# Patient Record
Sex: Female | Born: 1970 | Race: White | Hispanic: No | Marital: Single | Smoking: Current every day smoker
Health system: Southern US, Community
[De-identification: ages and names within clinical notes are randomized; demographics above are authoritative.]

## PROBLEM LIST (undated history)

## (undated) HISTORY — PX: BREAST LUMPECTOMY: SHX2

## (undated) HISTORY — PX: SPINE SURGERY: SHX786

---

## 2009-05-05 ENCOUNTER — Ambulatory Visit: Payer: Self-pay | Admitting: Occupational Medicine

## 2009-05-05 DIAGNOSIS — J309 Allergic rhinitis, unspecified: Secondary | ICD-10-CM | POA: Insufficient documentation

## 2009-05-05 DIAGNOSIS — M654 Radial styloid tenosynovitis [de Quervain]: Secondary | ICD-10-CM

## 2009-06-02 ENCOUNTER — Ambulatory Visit: Payer: Self-pay | Admitting: Occupational Medicine

## 2009-06-02 LAB — CONVERTED CEMR LAB
Bilirubin Urine: NEGATIVE
Ketones, urine, test strip: NEGATIVE
Protein, U semiquant: 30
pH: 7

## 2009-11-24 ENCOUNTER — Ambulatory Visit: Payer: Self-pay | Admitting: Emergency Medicine

## 2009-11-24 LAB — CONVERTED CEMR LAB
Beta hcg, urine, semiquantitative: NEGATIVE
Bilirubin Urine: NEGATIVE
Glucose, Urine, Semiquant: NEGATIVE
Ketones, urine, test strip: NEGATIVE
Specific Gravity, Urine: >=1.03

## 2009-11-25 ENCOUNTER — Encounter: Payer: Self-pay | Admitting: Emergency Medicine

## 2010-01-30 ENCOUNTER — Ambulatory Visit
Admission: RE | Admit: 2010-01-30 | Discharge: 2010-01-30 | Payer: Self-pay | Source: Home / Self Care | Admitting: Family Medicine

## 2010-01-31 ENCOUNTER — Ambulatory Visit
Admission: RE | Admit: 2010-01-31 | Discharge: 2010-01-31 | Payer: Self-pay | Source: Home / Self Care | Admitting: Family Medicine

## 2010-01-31 ENCOUNTER — Telehealth (INDEPENDENT_AMBULATORY_CARE_PROVIDER_SITE_OTHER): Payer: Self-pay | Admitting: *Deleted

## 2010-01-31 ENCOUNTER — Encounter: Payer: Self-pay | Admitting: Family Medicine

## 2010-02-01 ENCOUNTER — Ambulatory Visit
Admission: RE | Admit: 2010-02-01 | Discharge: 2010-02-01 | Payer: Self-pay | Source: Home / Self Care | Admitting: Family Medicine

## 2010-02-01 ENCOUNTER — Encounter: Payer: Self-pay | Admitting: Family Medicine

## 2010-02-02 ENCOUNTER — Encounter: Payer: Self-pay | Admitting: Family Medicine

## 2010-02-04 ENCOUNTER — Ambulatory Visit
Admission: RE | Admit: 2010-02-04 | Discharge: 2010-02-04 | Payer: Self-pay | Source: Home / Self Care | Admitting: Emergency Medicine

## 2010-02-07 ENCOUNTER — Telehealth (INDEPENDENT_AMBULATORY_CARE_PROVIDER_SITE_OTHER): Payer: Self-pay | Admitting: *Deleted

## 2010-02-10 NOTE — Assessment & Plan Note (Signed)
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  today she woke up with right radial wrist pain.   Pain is worse with pinch and adduction of the wrist.  No warmth or swelling.  No other joints involved.  No history of trauma or injury.   Says when she was counting money all day today, pain worsened.  Denies any history of DeQuervains.   Current Problems: DE QUERVAIN'S TENOSYNOVITIS (ICD-727.04) ALLERGIC RHINITIS (ICD-477.9)   Current Meds FLONASE 50 MCG/ACT SUSP (FLUTICASONE PROPIONATE) As directed DICLOFENAC SODIUM 50 MG TBEC (DICLOFENAC SODIUM) 1 by mouth 2 times daily. take with food  REVIEW OF SYSTEMS Constitutional Symptoms      Denies fever, chills, night sweats, weight loss, weight gain, and fatigue.  Eyes       Denies change in vision, eye pain, eye discharge, glasses, contact lenses, and eye surgery. Ear/Nose/Throat/Mouth       Denies hearing loss/aids, change in hearing, ear pain, ear discharge, dizziness, frequent runny nose, frequent nose bleeds, sinus problems, sore throat, hoarseness, and tooth pain or bleeding.  Respiratory       Denies dry cough, productive cough, wheezing, shortness of breath, asthma, bronchitis, and emphysema/COPD.  Cardiovascular       Denies murmurs, chest pain, and tires easily with exhertion.    Gastrointestinal       Denies stomach pain, nausea/vomiting, diarrhea, constipation,  blood in bowel movements, and indigestion. Genitourniary       Denies painful urination, kidney stones, and loss of urinary control. Neurological       Denies paralysis, seizures, and fainting/blackouts. Musculoskeletal       Complains of decreased range of motion.      Denies muscle pain, joint pain, joint stiffness, redness, swelling, muscle weakness, and gout.      Comments: R Hand Pain x 2 dys Skin       Denies bruising, unusual mles/lumps or sores, and hair/skin or nail changes.  Psych       Denies mood changes, temper/anger issues, anxiety/stress, speech problems, depression, and sleep problems. Other Comments: Pt states she went hiking this past weekend but did not injure her hand.    Past History:  Past Medical History: Allergic rhinitis  Past Surgical History: Caesarean section Lumpectomy TMJ Surgery Bunionectomy  Social History: Single Current Smoker - 1 pack daily Alcohol use-yes, occassionally Drug use-no Regular exercise-no Smoking Status:  current Drug Use:  no Does Patient Exercise:  no Physical Exam Chest/Lungs: no rales, wheezes, or rhonchi bilateral, breath sounds equal without effort Heart: regular rate and  rhythm, no murmur Extremities:  Right wrist:  FROM:   Tender in first dorsal compartment.   Positve finklesteins.  Negative tinels.  Assessment New Problems: DE QUERVAIN'S TENOSYNOVITIS (ICD-727.04) ALLERGIC RHINITIS (ICD-477.9)   Plan New Medications/Changes: DICLOFENAC SODIUM 50 MG TBEC (DICLOFENAC SODIUM) 1 by mouth 2 times daily. take with food  #30 x 0, 05/05/2009, Kathrine Haddock MD  New Orders: New Patient Level III [phone] Planning Comments:   Thumb SPICA splint Diclofenac 75mg  twice a day Follow up with ortho if no better by Thursday.   The patient and/or caregiver has been counseled thoroughly with regard to medications prescribed including dosage, schedule, interactions, rationale for use, and possible side effects and they  verbalize understanding.  Diagnoses and expected course of recovery discussed and will return if not improved as expected or if the condition worsens. Patient and/or caregiver verbalized understanding.  Prescriptions: DICLOFENAC SODIUM 50 MG TBEC (DICLOFENAC SODIUM) 1 by mouth 2 times daily. take with food  #30 x 0   Entered and Authorized by:   Kathrine Haddock MD   Signed by:   Kathrine Haddock MD on 05/05/2009   Method used:   Print then Give to Patient   RxID:   [id number]

## 2010-02-10 NOTE — Assessment & Plan Note (Signed)
Summary: sorethroat, sinus presure, headache, R ear pain x 1 wk rm 3   Vital Signs:  Patient Profile:   40 Years Old Female CC:      Cold & URI symptoms LMP:     05/25/2009 Height:     64 inches Weight:      187 pounds O2 Sat:      98 % O2 treatment:    Room Air Temp:     97.8 degrees F oral Pulse rate:   81 / minute Pulse rhythm:   regular Resp:     16 per minute BP sitting:   115 / 81  (right arm) Cuff size:   regular  Vitals Entered By: Areta Haber CMA (Jun 02, 2009 6:02 PM)  Menstrual History: LMP (date): 05/25/2009 LMP - Character: normal LMP - Reliable: Yes                  Current Allergies (reviewed today): ! BIAXIN      History of Present Illness Chief Complaint: Cold & URI symptoms History of Present Illness: Presents with a 7 day history of sinus congestion, ear fullness, sore thoat and generally feeling crappy.   No reports of fever.    Also complains of onset of urinary burning and frequency for the last 1-2 days.   Also has low back pain.  No abdominal pain, just suprapubic discomfort.   Current Problems: ACUTE SINUSITIS, UNSPECIFIED (ICD-461.9) URINARY TRACT INFECTION (ICD-599.0) DE QUERVAIN'S TENOSYNOVITIS (ICD-727.04) ALLERGIC RHINITIS (ICD-477.9)   Current Meds FLONASE 50 MCG/ACT SUSP (FLUTICASONE PROPIONATE) As directed CLARITIN 10 MG TABS (LORATADINE) 1 tab by mouth once daily SULFAMETHOXAZOLE-TRIMETHOPRIM 400-80 MG TABS (SULFAMETHOXAZOLE-TRIMETHOPRIM) one tablet twice a day for 10 days  REVIEW OF SYSTEMS Constitutional Symptoms      Denies fever, chills, night sweats, weight loss, weight gain, and fatigue.  Eyes       Denies change in vision, eye pain, eye discharge, glasses, contact lenses, and eye surgery. Ear/Nose/Throat/Mouth       Complains of ear pain, sinus problems, sore throat, and hoarseness.      Denies hearing loss/aids, change in hearing, ear discharge, dizziness, frequent nose bleeds, and tooth pain or bleeding.       Comments: R ear pain x 1 wk Respiratory       Complains of dry cough.      Denies productive cough, wheezing, shortness of breath, asthma, bronchitis, and emphysema/COPD.  Cardiovascular       Denies murmurs, chest pain, and tires easily with exhertion.    Gastrointestinal       Denies stomach pain, nausea/vomiting, diarrhea, constipation, blood in bowel movements, and indigestion. Genitourniary       Denies painful urination, kidney stones, and loss of urinary control. Neurological       Complains of headaches.      Denies paralysis, seizures, and fainting/blackouts. Musculoskeletal       Denies muscle pain, joint pain, joint stiffness, decreased range of motion, redness, swelling, muscle weakness, and gout.  Skin       Denies bruising, unusual mles/lumps or sores, and hair/skin or nail changes.  Psych       Denies mood changes, temper/anger issues, anxiety/stress, speech problems, depression, and sleep problems. Other Comments: pt has not seen a PCP for this. Pt also states that she is now experiencing frequent, painful urination x today.   Past History:  Past Medical History: Last updated: 05/05/2009 Allergic rhinitis  Past Surgical History: Last updated: 05/05/2009 Caesarean  section Lumpectomy TMJ Surgery Bunionectomy  Social History: Last updated: 05/05/2009 Single Current Smoker - 1 pack daily Alcohol use-yes, occassionally Drug use-no Regular exercise-no  Risk Factors: Exercise: no (05/05/2009)  Risk Factors: Smoking Status: current (05/05/2009) Physical Exam Eyes: conjunctivae and lids normal Pupils: equal, round, reactive to light Ears: normal, no lesions or deformities Nasal: swollen red turbinates with congestion Oral/Pharynx: tongue normal, posterior pharynx without erythema or exudate Neck: supple,anterior lymphadenopathy present Chest/Lungs: no rales, wheezes, or rhonchi bilateral, breath sounds equal without effort Heart: regular rate and   rhythm, no murmur Abdomen: suprapubic tenderness.  Soft.  Positive bowel sounds.  Assessment New Problems: ACUTE SINUSITIS, UNSPECIFIED (ICD-461.9) URINARY TRACT INFECTION (ICD-599.0)   Plan New Medications/Changes: FLONASE 50 MCG/ACT SUSP (FLUTICASONE PROPIONATE) As directed  #1 x 1, 06/02/2009, Kathrine Haddock MD SULFAMETHOXAZOLE-TRIMETHOPRIM 400-80 MG TABS (SULFAMETHOXAZOLE-TRIMETHOPRIM) one tablet twice a day for 10 days  #20 x 0, 06/02/2009, Kathrine Haddock MD  New Orders: Est. Patient Level II [phone]  The patient and/or caregiver has been counseled thoroughly with regard to medications prescribed including dosage, schedule, interactions, rationale for use, and possible side effects and they verbalize understanding.  Diagnoses and expected course of recovery discussed and will return if not improved as expected or if the condition worsens. Patient and/or caregiver verbalized understanding.  Prescriptions: FLONASE 50 MCG/ACT SUSP (FLUTICASONE PROPIONATE) As directed  #1 x 1   Entered and Authorized by:   Kathrine Haddock MD   Signed by:   Kathrine Haddock MD on 06/02/2009   Method used:   Electronically to        [facility] [phone]* (retail)       [address] [place], [place]  [address]       Ph: [phone] or [phone]       Fax: [phone]   RxID:   [id number] SULFAMETHOXAZOLE-TRIMETHOPRIM 400-80 MG TABS (SULFAMETHOXAZOLE-TRIMETHOPRIM) one tablet twice a day for 10 days  #20 x 0   Entered and Authorized by:   Kathrine Haddock MD   Signed by:   Kathrine Haddock MD on 06/02/2009   Method used:   Electronically to        [facility] [phone]* (retail)       [address] [place], [place]  [address]       Ph: [phone] or [phone]       Fax: [phone]   RxID:   [id number]   Patient Instructions: 1)  1. Drink large amounts of fluids (cranberry juice of water; avoid caffeinated beverages).  Finish the entire prescription of antibiotic  prescribed and use AZO (over the counter) or Pyridium (prescription) as prescribed for 2 days.  (Pyridium and AZO may turn your urine and contact lenses orange) 2)  Bactrim DS Bid for 10 days for both UTI and sinusitus 3)  Follow up with PCP if no better in 1 week 4)  I went ahead and refilled your flonase.  5)  Recommend discussion of allergic symtpoms with PCP  Laboratory Results   Urine Tests  Date/Time Received: Jun 02, 2009 6:25 PM  Date/Time Reported: Jun 02, 2009 6:25 PM   Routine Urinalysis   Color: yellow Appearance: Hazy Glucose: negative   (Normal Range: Negative) Bilirubin: negative   (Normal Range: Negative) Ketone: negative   (Normal Range: Negative) Spec. Gravity: 1.015   (Normal Range: 1.003-1.035) Blood: trace-intact   (  Normal Range: Negative) pH: 7.0   (Normal Range: 5.0-8.0) Protein: 30   (Normal Range: Negative) Urobilinogen: 0.2   (Normal Range: 0-1) Nitrite: negative   (Normal Range: Negative) Leukocyte Esterace: small   (Normal Range: Negative)    Urine HCG: negative

## 2010-02-10 NOTE — Assessment & Plan Note (Signed)
Summary: Vag disch - no odor x 1 wk rm 5   Vital Signs:  Patient Profile:   40 Years Old Female CC:      Vaginal Dischargex 1wk LMP:     11/06/2009 Height:     64 inches Weight:      187 pounds O2 Sat:      100 % O2 treatment:    Room Air Temp:     98.8 degrees F oral Pulse rate:   90 / minute Pulse rhythm:   irregular Resp:     16 per minute BP sitting:   128 / 85  (left arm) Cuff size:   regular  Vitals Entered By: Areta Haber CMA (November 24, 2009 5:33 PM)  Menstrual History: LMP (date): 11/06/2009 LMP - Character: light                  Current Allergies: ! BIAXIN    History of Present Illness History from: patient Chief Complaint: Vaginal Dischargex 1wk History of Present Illness: Vaginal d/c for 1 week.  Had UTI/kidney infx a month ago tx with ABX (unknown).  Then developed vag d/c thin white itchy no odor.  Then had her period that was lighter than usual.  After her cycle, the itching and d/c got worse.  Thick yellowish mild odor, very itchy.  She is prone to getting yeast infx with ABX but also has had BV in the past.  Did use a douche a few weeks ago.  Current Meds CLARITIN 10 MG TABS (LORATADINE) 1 tab by mouth once daily FLUCONAZOLE 150 MG TABS (FLUCONAZOLE) 1 tab by mouth x1 dose.  May repeat in 2 days if symptoms continue. METRONIDAZOLE 500 MG TABS (METRONIDAZOLE) 1 tab by mouth two times a day for 7 days LIDOCAINE VISCOUS 2 % SOLN (LIDOCAINE HCL) apply to affected area a few times a day as needed for pain  REVIEW OF SYSTEMS Constitutional Symptoms      Denies fever, chills, night sweats, weight loss, weight gain, and fatigue.  Eyes       Denies change in vision, eye pain, eye discharge, glasses, contact lenses, and eye surgery. Ear/Nose/Throat/Mouth       Denies hearing loss/aids, change in hearing, ear pain, ear discharge, dizziness, frequent runny nose, frequent nose bleeds, sinus problems, sore throat, hoarseness, and tooth pain or bleeding.   Respiratory       Denies dry cough, productive cough, wheezing, shortness of breath, asthma, bronchitis, and emphysema/COPD.  Cardiovascular       Denies murmurs, chest pain, and tires easily with exhertion.    Gastrointestinal       Denies stomach pain, nausea/vomiting, diarrhea, constipation, blood in bowel movements, and indigestion. Genitourniary       Complains of painful urination and blood or discharge from vagina.      Denies kidney stones and loss of urinary control.      Comments: thick, yellowish, no odor x 1 wk Neurological       Denies paralysis, seizures, and fainting/blackouts. Musculoskeletal       Denies muscle pain, joint pain, joint stiffness, decreased range of motion, redness, swelling, muscle weakness, and gout.  Skin       Denies bruising, unusual mles/lumps or sores, and hair/skin or nail changes.  Psych       Denies mood changes, temper/anger issues, anxiety/stress, speech problems, depression, and sleep problems. Other Comments: Pt states she was seen in [facility] ER on 10/20/09 Dx Kidney Infection.  Pt states she's prone to yeast infection but failed to get prescription for that from ER. Pt states she has scratched herself until it burns when she voids. Pt states that this was not her normal menstral cycyle.  Pt does not have a PCP.   Past History:  Past Medical History: Last updated: 05/05/2009 Allergic rhinitis  Social History: Last updated: 05/05/2009 Single Current Smoker - 1 pack daily Alcohol use-yes, occassionally Drug use-no Regular exercise-no  Risk Factors: Exercise: no (05/05/2009)  Risk Factors: Smoking Status: current (05/05/2009)  Past Surgical History: Caesarean section Lumpectomy TMJ Surgery Bunionectomy Tubal ligation C Spine surgery Physical Exam General appearance: well developed, well nourished, no acute distress Chest/Lungs: no rales, wheezes, or rhonchi bilateral, breath sounds equal without  effort Heart: regular rate and  rhythm, no murmur GU: deferred MSE: oriented to time, place, and person Assessment New Problems: VAGINITIS (ICD-616.10)  likely yeast vaginitis, but history is also c/w possible BV. UA with lysed RBC and elevated leuks likely from the vaginal irritation.  However will send UCx to see if anything grows. UPT is negative  Patient Education: Patient and/or caregiver instructed in the following: fluids.  Plan New Medications/Changes: LIDOCAINE VISCOUS 2 % SOLN (LIDOCAINE HCL) apply to affected area a few times a day as needed for pain  #QS x1 wk x 0, 11/24/2009, Hoyt Koch MD METRONIDAZOLE 500 MG TABS (METRONIDAZOLE) 1 tab by mouth two times a day for 7 days  #14 x 0, 11/24/2009, Hoyt Koch MD FLUCONAZOLE 150 MG TABS (FLUCONAZOLE) 1 tab by mouth x1 dose.  May repeat in 2 days if symptoms continue.  #2 x 0, 11/24/2009, Hoyt Koch MD  New Orders: Est. Patient Level III [phone] UA Dipstick w/o Micro (automated)  [81003] Urine Pregnancy Test  [81025] T-Culture, Urine [[id number]] Planning Comments:   Will treat for both yeast and BV.  Symptoms should start to lessen over the next 3-5 days.  If not, then patient will need to get swabbed for [facility] and Chl and consider referral to Gyn. Hydration.  Avoid alcohol while taking Flagyl.  Eat yogurt.  Try not to scratch and irritate the area further. Urine culture is pending   The patient and/or caregiver has been counseled thoroughly with regard to medications prescribed including dosage, schedule, interactions, rationale for use, and possible side effects and they verbalize understanding.  Diagnoses and expected course of recovery discussed and will return if not improved as expected or if the condition worsens. Patient and/or caregiver verbalized understanding.  Prescriptions: LIDOCAINE VISCOUS 2 % SOLN (LIDOCAINE HCL) apply to affected area a few times a day as needed for pain  #QS x1 wk x 0    Entered and Authorized by:   Hoyt Koch MD   Signed by:   Hoyt Koch MD on 11/24/2009   Method used:   Print then Give to Patient   RxID:   [id number] METRONIDAZOLE 500 MG TABS (METRONIDAZOLE) 1 tab by mouth two times a day for 7 days  #14 x 0   Entered and Authorized by:   Hoyt Koch MD   Signed by:   Hoyt Koch MD on 11/24/2009   Method used:   Print then Give to Patient   RxID:   [id number] FLUCONAZOLE 150 MG TABS (FLUCONAZOLE) 1 tab by mouth x1 dose.  May repeat in 2 days if symptoms continue.  #2 x 0   Entered and Authorized by:   Hoyt Koch MD   Signed by:   Hoyt Koch MD  on 11/24/2009   Method used:   Print then Give to Patient   RxID:   [id number]   Orders Added: 1)  Est. Patient Level III [14782] 2)  UA Dipstick w/o Micro (automated)  [81003] 3)  Urine Pregnancy Test  [81025] 4)  T-Culture, Urine [[id number]]    Laboratory Results   Urine Tests  Date/Time Received: November 24, 2009 6:16 PM  Date/Time Reported: November 24, 2009 6:16 PM   Routine Urinalysis   Color: yellow Appearance: Cloudy Glucose: negative   (Normal Range: Negative) Bilirubin: negative   (Normal Range: Negative) Ketone: negative   (Normal Range: Negative) Spec. Gravity: >=1.030   (Normal Range: 1.003-1.035) Blood: trace-lysed   (Normal Range: Negative) pH: 5.5   (Normal Range: 5.0-8.0) Protein: negative   (Normal Range: Negative) Urobilinogen: 0.2   (Normal Range: 0-1) Nitrite: negative   (Normal Range: Negative) Leukocyte Esterace: large   (Normal Range: Negative)    Urine HCG: negative

## 2010-02-12 NOTE — Assessment & Plan Note (Signed)
Summary: F/U OV/TM procedure rm   Vital Signs:  Patient Profile:   40 Years Old Female CC:      F/U abscess Height:     64 inches O2 Sat:      96 % O2 treatment:    Room Air Temp:     98.7 degrees F oral Pulse rate:   101 / minute Resp:     16 per minute BP standing:   111 / 76  (left arm) Cuff size:   regular                  Updated Prior Medication List: CLARITIN 10 MG TABS (LORATADINE) 1 tab by mouth once daily SULFAMETHOXAZOLE-TMP DS 800-160 MG TABS (SULFAMETHOXAZOLE-TRIMETHOPRIM) Two tabs by mouth Q12hr MINOCYCLINE HCL 100 MG CAPS (MINOCYCLINE HCL) 1 by mouth two times a day LORTAB 5 5-500 MG TABS (HYDROCODONE-ACETAMINOPHEN) One tab by mouth q4 to 6hr as needed pain.  Current Allergies (reviewed today): ! BIAXINHistory of Present Illness Chief Complaint: F/U abscess History of Present Illness:  Subjective:  Patient returns for follow-up.  She reports that she feels better with less pain, and has had no fevers, chills, and sweats.  REVIEW OF SYSTEMS Constitutional Symptoms      Denies fever, chills, night sweats, weight loss, weight gain, and fatigue.  Eyes       Denies change in vision, eye pain, eye discharge, glasses, contact lenses, and eye surgery. Ear/Nose/Throat/Mouth       Denies hearing loss/aids, change in hearing, ear pain, ear discharge, dizziness, frequent runny nose, frequent nose bleeds, sinus problems, sore throat, hoarseness, and tooth pain or bleeding.  Respiratory       Denies dry cough, productive cough, wheezing, shortness of breath, asthma, bronchitis, and emphysema/COPD.  Cardiovascular       Denies murmurs, chest pain, and tires easily with exhertion.    Gastrointestinal       Denies stomach pain, nausea/vomiting, diarrhea, constipation, blood in bowel movements, and indigestion. Genitourniary       Denies painful urination, kidney stones, and loss of urinary control. Neurological       Denies paralysis, seizures, and  fainting/blackouts. Musculoskeletal       Denies muscle pain, joint pain, joint stiffness, decreased range of motion, redness, swelling, muscle weakness, and gout.  Skin       Denies bruising, unusual mles/lumps or sores, and hair/skin or nail changes.  Psych       Denies mood changes, temper/anger issues, anxiety/stress, speech problems, depression, and sleep problems. Other Comments: The pt is here today for a F/U on her abscess.   Past History:  Past Medical History: Reviewed history from 05/05/2009 and no changes required. Allergic rhinitis  Past Surgical History: Reviewed history from 11/24/2009 and no changes required. Caesarean section Lumpectomy TMJ Surgery Bunionectomy Tubal ligation C Spine surgery  Family History: Reviewed history from 01/31/2010 and no changes required. none  Social History: Reviewed history from 05/05/2009 and no changes required. Single Current Smoker - 1 pack daily Alcohol use-yes, occassionally Drug use-no Regular exercise-no   Objective:  Appears comfortable and alert Skin:  Right inguinal area:  Decreased erythema and tenderness.  Minimal swelling.  After removal of packing, no purulent discharge.  Cavity quite shallow.  Irrigated with H2O2 and sterile dressing applied. Assessment New Problems: ENCOUNTER CHANGE/REMOVAL SURGICAL WOUND DRESSING (ICD-V58.31)   Plan New Medications/Changes: DIFLUCAN 150 MG TABS (FLUCONAZOLE) One by mouth as a single dose.  May repeat 3 to 4 days  later.  #2 x 0, 02/01/2010, Donna Christen MD  New Orders: Dressing 4x4 UP [A6402] No Charge Patient Arrived (NCPA0) [NCPA0] Planning Comments:   Wound culture pending.  Continue antibiotics.  Change dressing daily and irrigate with H2O2.  Rx written for Diflucan if yeast infection develops.  Return for increasing pain, swelling, fever, etc.   The patient and/or caregiver has been counseled thoroughly with regard to medications prescribed including dosage,  schedule, interactions, rationale for use, and possible side effects and they verbalize understanding.  Diagnoses and expected course of recovery discussed and will return if not improved as expected or if the condition worsens. Patient and/or caregiver verbalized understanding.  Prescriptions: DIFLUCAN 150 MG TABS (FLUCONAZOLE) One by mouth as a single dose.  May repeat 3 to 4 days later.  #2 x 0   Entered and Authorized by:   Donna Christen MD   Signed by:   Donna Christen MD on 02/01/2010   Method used:   Print then Give to Patient   RxID:   [id number]   Orders Added: 1)  Dressing 4x4 UP [A6402] 2)  No Charge Patient Arrived (NCPA0) [NCPA0]

## 2010-02-12 NOTE — Assessment & Plan Note (Signed)
Summary: POSSIBLE SPIDER BITE?NH (rm 4)   Vital Signs:  Patient Profile:   40 Years Old Female CC:      infection in right groin x 3 days Height:     64 inches Weight:      185 pounds O2 Sat:      96 % O2 treatment:    Room Air Temp:     99.0 degrees F oral Pulse rate:   72 / minute Resp:     14 per minute BP sitting:   125 / 82  (left arm) Cuff size:   regular  Vitals Entered By: Lajean Saver RN (January 30, 2010 3:46 PM)                  Updated Prior Medication List: CLARITIN 10 MG TABS (LORATADINE) 1 tab by mouth once daily  Current Allergies (reviewed today): ! BIAXINHistory of Present Illness Chief Complaint: infection in right groin x 3 days History of Present Illness:  Subjective:  Patient complains of development of a tender lump in her right groin that appeared 3 days ago, and gradually increased in size.  No drainage from the area.  No fevers, chills, and sweats.  She feels well otherwise.  REVIEW OF SYSTEMS Constitutional Symptoms      Denies fever, chills, night sweats, weight loss, weight gain, and fatigue.  Eyes       Denies change in vision, eye pain, eye discharge, glasses, contact lenses, and eye surgery. Ear/Nose/Throat/Mouth       Denies hearing loss/aids, change in hearing, ear pain, ear discharge, dizziness, frequent runny nose, frequent nose bleeds, sinus problems, sore throat, hoarseness, and tooth pain or bleeding.  Respiratory       Denies dry cough, productive cough, wheezing, shortness of breath, asthma, bronchitis, and emphysema/COPD.  Cardiovascular       Denies murmurs, chest pain, and tires easily with exhertion.    Gastrointestinal       Denies stomach pain, nausea/vomiting, diarrhea, constipation, blood in bowel movements, and indigestion. Genitourniary       Denies painful urination, blood or discharge from vagina, kidney stones, and loss of urinary control. Neurological       Denies paralysis, seizures, and  fainting/blackouts. Musculoskeletal       Denies muscle pain, joint pain, joint stiffness, decreased range of motion, redness, swelling, muscle weakness, and gout.  Skin       Complains of unusual moles/lumps or sores.      Denies bruising and hair/skin or nail changes.      Comments: infection to right groin Psych       Denies mood changes, temper/anger issues, anxiety/stress, speech problems, depression, and sleep problems. Other Comments: Patient  c/o infection to right groin x 3 days. She has two infected areas, once of which she has drained, the other is larger with area redness and swelling and pain. She does not recall being bitten and she does shave every day   Past History:  Past Medical History: Reviewed history from 05/05/2009 and no changes required. Allergic rhinitis  Past Surgical History: Reviewed history from 11/24/2009 and no changes required. Caesarean section Lumpectomy TMJ Surgery Bunionectomy Tubal ligation C Spine surgery  Social History: Reviewed history from 05/05/2009 and no changes required. Single Current Smoker - 1 pack daily Alcohol use-yes, occassionally Drug use-no Regular exercise-no   Objective:  No acute distress  Lungs:  Clear to auscultation.  Breath sounds are equal.  Heart:  Regular rate and rhythm  without murmurs, rubs, or gallops.  Abdomen:  Nontender without masses or hepatosplenomegaly.  Bowel sounds are present.  No CVA or flank tenderness.  Skin:  In the right superior groin area is a 2cm by 3cm indurated erythematous tender area surrounded by about 3cm by 8cm of erythema.  No fluctuance.  Several small satellite lesions present.  Tender right inguinal nodes present. Assessment New Problems: CELLULITIS, GROIN, RIGHT (ICD-682.2)   Plan New Medications/Changes: SULFAMETHOXAZOLE-TMP DS 800-160 MG TABS (SULFAMETHOXAZOLE-TRIMETHOPRIM) Two tabs by mouth Q12hr  #28 x 0, 01/30/2010, Donna Christen MD  New Orders: Est. Patient Level  III [phone] Planning Comments:   Begin Septra.  Continue warm compresses. Return if not improving 48 hours (may need I and D), or if develops fever.   The patient and/or caregiver has been counseled thoroughly with regard to medications prescribed including dosage, schedule, interactions, rationale for use, and possible side effects and they verbalize understanding.  Diagnoses and expected course of recovery discussed and will return if not improved as expected or if the condition worsens. Patient and/or caregiver verbalized understanding.  Prescriptions: SULFAMETHOXAZOLE-TMP DS 800-160 MG TABS (SULFAMETHOXAZOLE-TRIMETHOPRIM) Two tabs by mouth Q12hr  #28 x 0   Entered and Authorized by:   Donna Christen MD   Signed by:   Donna Christen MD on 01/30/2010   Method used:   Print then Give to Patient   RxID:   [id number]   Orders Added: 1)  Est. Patient Level III [57846]

## 2010-02-12 NOTE — Letter (Signed)
Summary: Out of Work  [facility]  [address]   [place], [place] [address]   Phone: [phone]  Fax: [phone]    February 01, 2010   Employee:  Ladajah Christiano    To Whom It May Concern:   For Medical reasons, please excuse the above named employee from work 02/02/10 and 02/03/10.  She does not have a contagious condition.  If you need additional information, please feel free to contact our office.         Sincerely,    Donna Christen MD

## 2010-02-12 NOTE — Progress Notes (Signed)
  Phone Note Outgoing Call Call back at [facility] Phone [phone]   Caller: Patient Call placed by: Emilio Math,  February 07, 2010 2:41 PM Call placed to: Patient Summary of Call: Feeling better starting to close up

## 2010-02-12 NOTE — Assessment & Plan Note (Signed)
Summary: F/U SKIN INFEC/TM procedure rm   Vital Signs:  Patient Profile:   40 Years Old Female CC:      F/U abscess Height:     64 inches O2 Sat:      96 % O2 treatment:    Room Air Temp:     99.3 degrees F oral Pulse rate:   64 / minute Resp:     16 per minute BP sitting:   138 / 88  (left arm) Cuff size:   regular  Vitals Entered By: Clemens Catholic LPN (January 31, 2010 4:35 PM)                  Updated Prior Medication List: CLARITIN 10 MG TABS (LORATADINE) 1 tab by mouth once daily SULFAMETHOXAZOLE-TMP DS 800-160 MG TABS (SULFAMETHOXAZOLE-TRIMETHOPRIM) Two tabs by mouth Q12hr  Current Allergies (reviewed today): ! BIAXINHistory of Present Illness Chief Complaint: F/U abscess History of Present Illness:  Subjective:  Patient complains of increasing pain/swelling in her groin area.  Today she noted low grade fever, chills, myalgias, fatigue.  REVIEW OF SYSTEMS Constitutional Symptoms      Denies fever, chills, night sweats, weight loss, weight gain, and fatigue.  Eyes       Denies change in vision, eye pain, eye discharge, glasses, contact lenses, and eye surgery. Ear/Nose/Throat/Mouth       Denies hearing loss/aids, change in hearing, ear pain, ear discharge, dizziness, frequent runny nose, frequent nose bleeds, sinus problems, sore throat, hoarseness, and tooth pain or bleeding.  Respiratory       Denies dry cough, productive cough, wheezing, shortness of breath, asthma, bronchitis, and emphysema/COPD.  Cardiovascular       Denies murmurs, chest pain, and tires easily with exhertion.    Gastrointestinal       Complains of stomach pain.      Denies nausea/vomiting, diarrhea, constipation, blood in bowel movements, and indigestion. Genitourniary       Denies painful urination, kidney stones, and loss of urinary control. Neurological       Denies paralysis, seizures, and fainting/blackouts. Musculoskeletal       Complains of muscle pain, joint pain, redness,  and swelling.      Denies joint stiffness, decreased range of motion, muscle weakness, and gout.  Skin       Denies bruising, unusual mles/lumps or sores, and hair/skin or nail changes.  Psych       Denies mood changes, temper/anger issues, anxiety/stress, speech problems, depression, and sleep problems. Other Comments: pts states that the abscess looks worse and is more painful today. she statest that she feels achy all over and she is weak.   Past History:  Past Medical History: Reviewed history from 05/05/2009 and no changes required. Allergic rhinitis  Past Surgical History: Reviewed history from 11/24/2009 and no changes required. Caesarean section Lumpectomy TMJ Surgery Bunionectomy Tubal ligation C Spine surgery  Family History: Reviewed history and no changes required. none  Social History: Reviewed history from 05/05/2009 and no changes required. Single Current Smoker - 1 pack daily Alcohol use-yes, occassionally Drug use-no Regular exercise-no   Objective:  No acute distress  Pharynx:  Normal  Neck:  Supple.  No adenopathy is present.  Lungs:  Clear to auscultation.  Breath sounds are equal.  Heart:  Regular rate and rhythm without murmurs, rubs, or gallops.  Abdomen:  Nontender without masses or hepatosplenomegaly.  Bowel sounds are present.  No CVA or flank tenderness.  Skin:  Right inguinal area  reveals increase swelling, erythema, and tenderness at previously noted site of cellulitis.  Lesion now somewhat fluctuant. CBC:  WBC 12.1; 17.9 LY, 5.5 MO, 76.6 GR Assessment  Assessed CELLULITIS, GROIN, RIGHT as deteriorated - Donna Christen MD  Plan New Medications/Changes: LORTAB 5 5-500 MG TABS (HYDROCODONE-ACETAMINOPHEN) One tab by mouth q4 to 6hr as needed pain.  #12 (twelve) x 0, 01/31/2010, Donna Christen MD MINOCYCLINE HCL 100 MG CAPS (MINOCYCLINE HCL) 1 by mouth two times a day  #20 x 0, 01/31/2010, Donna Christen MD  New Orders: CBC w/Diff  [[id number]] Rocephin  250mg  [J0696] T-Culture, Wound [87070/87205-70190] Admin of Therapeutic Inj  intramuscular or subcutaneous [96372] Est. Patient Level III [09811] I&D Abscess, Simple / Single [10060] Planning Comments:   Rocephin 1gm IM.  Continue Septra.  Add minocycline for additional MRSA coverage.  Wound culture pending.  Analgesic for pain. Return tomorrow for dressing change and packing removal. If symptoms become significantly worse during the night or over the weekend, proceed to the local emergency room.   The patient and/or caregiver has been counseled thoroughly with regard to medications prescribed including dosage, schedule, interactions, rationale for use, and possible side effects and they verbalize understanding.  Diagnoses and expected course of recovery discussed and will return if not improved as expected or if the condition worsens. Patient and/or caregiver verbalized understanding.   PROCEDURE:   I & D Site: Right inguinal area Size: 3cm by 8cm Anesthesia: topical LET followed by local 1% lidocaine with epinephrine Procedure: Procedure:  Incise and Drain Abscess Risks and benefits of procedure explained to patient and verbal consent granted.  Using sterile technique and local anesthesia, cleansed affected area with Betadine and saline. Identified the most fluctuant area of lesion and incised with a #11 blade.  Expressed minimal amount of blood and purulent material.  Wound culture taken.  Inserted Iodoform gauze packing.  Bandage applied.  Patient tolerated well.  Disposition: Home Specimen sent to lab for evaluation. Prescriptions: LORTAB 5 5-500 MG TABS (HYDROCODONE-ACETAMINOPHEN) One tab by mouth q4 to 6hr as needed pain.  #12 (twelve) x 0   Entered and Authorized by:   Donna Christen MD   Signed by:   Donna Christen MD on 01/31/2010   Method used:   Print then Give to Patient   RxID:   [id number] MINOCYCLINE HCL 100 MG CAPS (MINOCYCLINE HCL) 1 by  mouth two times a day  #20 x 0   Entered and Authorized by:   Donna Christen MD   Signed by:   Donna Christen MD on 01/31/2010   Method used:   Print then Give to Patient   RxID:   [id number]   Medication Administration  Injection # 1:    Medication: Rocephin  250mg     Diagnosis: CELLULITIS, GROIN, RIGHT (ICD-682.2)    Route: IM    Site: RUOQ gluteus    Exp Date: 05/11/2012    Lot #: [id number]    Mfr: SANDOZ    Comments: 1G GIVEN    Patient tolerated injection without complications    Given by: Clemens Catholic LPN (January 31, 2010 5:57 PM)  Orders Added: 1)  CBC w/Diff [[id number]] 2)  Rocephin  250mg  [J0696] 3)  T-Culture, Wound [87070/[id number]] 4)  Admin of Therapeutic Inj  intramuscular or subcutaneous [96372] 5)  Est. Patient Level III [74259] 6)  I&D Abscess, Simple / Single [10060]

## 2010-02-12 NOTE — Assessment & Plan Note (Signed)
Summary: OPEN SORE RE-CHECK   Vital Signs:  Patient Profile:   40 Years Old Female CC:      re-check pelvic wound Height:     64 inches Weight:      187 pounds O2 Sat:      96 % O2 treatment:    Room Air Temp:     98.7 degrees F oral Pulse rate:   81 / minute Resp:     14 per minute BP sitting:   129 / 84  (left arm) Cuff size:   regular  Vitals Entered By: Lajean Saver RN (February 04, 2010 7:01 PM)                  Updated Prior Medication List: CLARITIN 10 MG TABS (LORATADINE) 1 tab by mouth once daily SULFAMETHOXAZOLE-TMP DS 800-160 MG TABS (SULFAMETHOXAZOLE-TRIMETHOPRIM) Two tabs by mouth Q12hr MINOCYCLINE HCL 100 MG CAPS (MINOCYCLINE HCL) 1 by mouth two times a day LORTAB 5 5-500 MG TABS (HYDROCODONE-ACETAMINOPHEN) One tab by mouth q4 to 6hr as needed pain. DIFLUCAN 150 MG TABS (FLUCONAZOLE) One by mouth as a single dose.  May repeat 3 to 4 days later.  Current Allergies (reviewed today): ! BIAXINHistory of Present Illness History from: patient Chief Complaint: re-check pelvic wound History of Present Illness: Here for follow up of her inguinal wound.  She would like it checked because it has had some drainage and discoloration of the wound and it seems more open now.  The redness and swelling are improving.  No F/C.  Pain is improved.  She is still taking the antibiotics.  REVIEW OF SYSTEMS Constitutional Symptoms      Denies fever, chills, night sweats, weight loss, weight gain, and fatigue.  Eyes       Denies change in vision, eye pain, eye discharge, glasses, contact lenses, and eye surgery. Ear/Nose/Throat/Mouth       Denies hearing loss/aids, change in hearing, ear pain, ear discharge, dizziness, frequent runny nose, frequent nose bleeds, sinus problems, sore throat, hoarseness, and tooth pain or bleeding.  Respiratory       Denies dry cough, productive cough, wheezing, shortness of breath, asthma, bronchitis, and emphysema/COPD.  Cardiovascular  Denies murmurs, chest pain, and tires easily with exhertion.    Gastrointestinal       Denies stomach pain, nausea/vomiting, diarrhea, constipation, blood in bowel movements, and indigestion. Genitourniary       Denies painful urination, kidney stones, and loss of urinary control. Neurological       Denies paralysis, seizures, and fainting/blackouts. Musculoskeletal       Denies muscle pain, joint pain, joint stiffness, decreased range of motion, redness, swelling, muscle weakness, and gout.  Skin       Denies bruising, unusual mles/lumps or sores, and hair/skin or nail changes.  Psych       Denies mood changes, temper/anger issues, anxiety/stress, speech problems, depression, and sleep problems. Other Comments: Patient wanted wound rechecked because of change in shape and drainage   Past History:  Past Medical History: Reviewed history from 05/05/2009 and no changes required. Allergic rhinitis  Past Surgical History: Reviewed history from 11/24/2009 and no changes required. Caesarean section Lumpectomy TMJ Surgery Bunionectomy Tubal ligation C Spine surgery  Family History: Reviewed history from 01/31/2010 and no changes required. none  Social History: Reviewed history from 05/05/2009 and no changes required. Single Current Smoker - 1 pack daily Alcohol use-yes, occassionally Drug use-no Regular exercise-no Physical Exam General appearance: well developed, well nourished, no  acute distress MSE: oriented to time, place, and person Right inguinal area:  Decreased erythema and tenderness.  Minimal swelling. No purulent discharge.  There is good granulation tissue.  Mild tenderness to palpation.    Patient Education: Patient and/or caregiver instructed in the following: rest, fluids, Ibuprofen prn.  Plan New Orders: No Charge Patient Arrived (NCPA0) [NCPA0] Planning Comments:   I explained to the patient that the wound actually looks good compared to the photos  that she has of the progression.  The tissue is healing.  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 Continue both antibiotics until gone for double coverage.  Heating pad to promote drainage.  There is no need for her to return to clinic if it continues to slowly improve.  Expect induration in that area even for 2 months as the wound heals.   The patient and/or caregiver has been counseled thoroughly with regard to medications prescribed including dosage, schedule, interactions, rationale for use, and possible side effects and they verbalize understanding.  Diagnoses and expected course of recovery discussed and will return if not improved as expected or if the condition worsens. Patient and/or caregiver verbalized understanding.   Orders Added: 1)  No Charge Patient Arrived (NCPA0) [NCPA0]

## 2010-02-12 NOTE — Progress Notes (Signed)
  Phone Note Outgoing Call   Call placed by: Clemens Catholic LPN,  January 31, 2010 11:36 AM Call placed to: Patient Summary of Call: call back: pt states that she is having some GI upset due to ABT she called her pharmacy and they advised her that it can cause this and to be sure to take with food. she states that the area seemed to be a little worse today but no fever and she did not start the ABT until late last night. told her if any changes to call or come in today. if improvement by tomorrow to come in for I and D. pt agrees. Initial call taken by: Clemens Catholic LPN,  January 31, 2010 11:39 AM

## 2010-04-09 ENCOUNTER — Inpatient Hospital Stay (INDEPENDENT_AMBULATORY_CARE_PROVIDER_SITE_OTHER)
Admission: RE | Admit: 2010-04-09 | Discharge: 2010-04-09 | Disposition: A | Discharge status: Home / Self Care | Payer: 59 | Source: Ambulatory Visit | Attending: Emergency Medicine | Admitting: Emergency Medicine

## 2010-04-09 ENCOUNTER — Encounter: Payer: Self-pay | Admitting: Emergency Medicine

## 2010-04-09 DIAGNOSIS — J029 Acute pharyngitis, unspecified: Secondary | ICD-10-CM | POA: Insufficient documentation

## 2010-04-09 LAB — CONVERTED CEMR LAB: Rapid Strep: NEGATIVE

## 2010-04-14 NOTE — Assessment & Plan Note (Signed)
Summary: SORE THROAT,EAR ACHE,OFF BALANCE/TJ   Vital Signs:  Patient Profile:   40 Years Old Female CC:      sore throat, fever x 2 days ear pain x today Height:     64 inches Weight:      187 pounds O2 Sat:      99 % O2 treatment:    Room Air Temp:     99.3 degrees F oral Pulse rate:   99 / minute Resp:     16 per minute BP sitting:   132 / 84  (left arm) Cuff size:   regular  Vitals Entered By: Lajean Saver RN (April 09, 2010 5:55 PM)                  Updated Prior Medication List: CLARITIN 10 MG TABS (LORATADINE) 1 tab by mouth once daily FLONASE 50 MCG/ACT SUSP (FLUTICASONE PROPIONATE)   Current Allergies (reviewed today): ! BIAXINHistory of Present Illness History from: patient Chief Complaint: sore throat, fever x 2 days ear pain x today History of Present Illness: 40 Years Old Female complains of onset of cold symptoms for 5 days.  Shina has been using Flonase (now out) & Claritin which is helping a little bit.  She has been exposed to 2 individuals (closely) that have strep throat. + sore throat No cough No pleuritic pain No wheezing No nasal congestion No post-nasal drainage No sinus pain/pressure No chest congestion No itchy/red eyes No earache No hemoptysis No SOB No chills/sweats No fever + mild nausea No vomiting No abdominal pain No diarrhea No skin rashes No fatigue No myalgias No headache   REVIEW OF SYSTEMS Constitutional Symptoms       Complains of fever and fatigue.     Denies chills, night sweats, weight loss, and weight gain.  Eyes       Denies change in vision, eye pain, eye discharge, glasses, contact lenses, and eye surgery. Ear/Nose/Throat/Mouth       Complains of ear pain and sore throat.      Denies hearing loss/aids, change in hearing, ear discharge, dizziness, frequent runny nose, frequent nose bleeds, sinus problems, hoarseness, and tooth pain or bleeding.  Respiratory       Denies dry cough, productive cough,  wheezing, shortness of breath, asthma, bronchitis, and emphysema/COPD.  Cardiovascular       Denies murmurs, chest pain, and tires easily with exhertion.    Gastrointestinal       Denies stomach pain, nausea/vomiting, diarrhea, constipation, blood in bowel movements, and indigestion. Genitourniary       Denies painful urination, kidney stones, and loss of urinary control. Neurological       Denies paralysis, seizures, and fainting/blackouts. Musculoskeletal       Denies muscle pain, joint pain, joint stiffness, decreased range of motion, redness, swelling, muscle weakness, and gout.  Skin       Denies bruising, unusual mles/lumps or sores, and hair/skin or nail changes.  Psych       Denies mood changes, temper/anger issues, anxiety/stress, speech problems, depression, and sleep problems. Other Comments: Taken BC powder for fever. 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 Caesarean section Lumpectomy TMJ Surgery Bunionectomy Tubal ligation C Spine surgery  Family History: Reviewed history from 01/31/2010 and no changes required. none  Social History: Single Current Smoker - 1 pack daily Alcohol  use-yes, occassionally Drug use-no Regular exercise-no Occupation: [employer] Physical Exam General appearance: well developed, well nourished, no acute distress Ears: normal, no lesions or deformities Nasal: mucosa pink, nonedematous, no septal deviation, turbinates normal Oral/Pharynx: pharyngeal erythema without exudate, uvula midline without deviation Neck: ant cerv LAD tender R>L Chest/Lungs: no rales, wheezes, or rhonchi bilateral, breath sounds equal without effort Heart: regular rate and  rhythm, no murmur MSE: oriented to time, place, and person Assessment New Problems: ACUTE PHARYNGITIS (ICD-462)   Plan New  Medications/Changes: PREDNISONE 20 MG TABS (PREDNISONE) 1 by mouth two times a day for 4 days, no taper  #QS x 0, 04/09/2010, Hoyt Koch MD AMOXICILLIN 875 MG TABS (AMOXICILLIN) 1 by mouth two times a day for 10 days  #20 x 0, 04/09/2010, Hoyt Koch MD  New Orders: Est. Patient Level IV [47829] Rapid Strep [56213] T-Culture, Throat [08657-84696] Services provided After hours-Weekends-Holidays [99051] Pulse Oximetry (single measurment) [94760] Planning Comments:   1)  Take the prescribed antibiotic as instructed.  Take Prednisone for symptom relief.  Change toothbrush in 2 days.  You are contagious for 24 hours.  Good hand hygeine.  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 2)  Use nasal saline solution (over the counter) at least 3 times a day. 3)  Use over the counter decongestants like Zyrtec-D every 12 hours as needed to help with congestion. 4)  Can take tylenol every 6 hours or motrin every 8 hours for pain or fever. 5)  Follow up with your primary doctor  if no improvement in 5-7 days, sooner if increasing pain, fever, or new symptoms.    The patient and/or caregiver has been counseled thoroughly with regard to medications prescribed including dosage, schedule, interactions, rationale for use, and possible side effects and they verbalize understanding.  Diagnoses and expected course of recovery discussed and will return if not improved as expected or if the condition worsens. Patient and/or caregiver verbalized understanding.  Prescriptions: PREDNISONE 20 MG TABS (PREDNISONE) 1 by mouth two times a day for 4 days, no taper  #QS x 0   Entered and Authorized by:   Hoyt Koch MD   Signed by:   Hoyt Koch MD on 04/09/2010   Method used:   Print then Give to Patient   RxID:   [id number] AMOXICILLIN 875 MG TABS (AMOXICILLIN) 1 by mouth two times a day for 10 days  #20 x 0   Entered and Authorized by:   Hoyt Koch MD   Signed by:   Hoyt Koch MD on 04/09/2010    Method used:   Print then Give to Patient   RxID:   [id number]   Orders Added: 1)  Est. Patient Level IV [63875] 2)  Rapid Strep [64332] 3)  T-Culture, Throat [95188-41660] 4)  Services provided After hours-Weekends-Holidays [99051] 5)  Pulse Oximetry (single measurment) [94760]    Laboratory Results  Date/Time Received: April 09, 2010 5:58 PM  Date/Time Reported: April 09, 2010 5:58 PM   Other Tests  Rapid Strep: negative  Kit Test Internal QC: Negative   (Normal Range: Negative)

## 2010-04-14 NOTE — Letter (Signed)
Summary: Out of Work  [facility]  [address]   [place], [place] [address]   Phone: [phone]  Fax: [phone]    April 09, 2010   Employee:  Tahjanae Manfredi    To Whom It May Concern:   For Medical reasons, the patient may return to work on Monday, April 13, 2010  If you need additional information, please feel free to contact our office.         Sincerely,    Hoyt Koch MD

## 2014-06-03 ENCOUNTER — Encounter (HOSPITAL_BASED_OUTPATIENT_CLINIC_OR_DEPARTMENT_OTHER): Payer: Self-pay | Admitting: Family Medicine

## 2014-06-03 ENCOUNTER — Emergency Department (HOSPITAL_BASED_OUTPATIENT_CLINIC_OR_DEPARTMENT_OTHER): Payer: No Typology Code available for payment source

## 2014-06-03 ENCOUNTER — Emergency Department (HOSPITAL_BASED_OUTPATIENT_CLINIC_OR_DEPARTMENT_OTHER)
Admission: EM | Admit: 2014-06-03 | Discharge: 2014-06-03 | Disposition: A | Discharge status: Home / Self Care | Payer: No Typology Code available for payment source | Attending: Emergency Medicine | Admitting: Emergency Medicine

## 2014-06-03 DIAGNOSIS — Z720 Tobacco use: Secondary | ICD-10-CM | POA: Insufficient documentation

## 2014-06-03 DIAGNOSIS — S161XXA Strain of muscle, fascia and tendon at neck level, initial encounter: Secondary | ICD-10-CM | POA: Diagnosis not present

## 2014-06-03 DIAGNOSIS — Z7951 Long term (current) use of inhaled steroids: Secondary | ICD-10-CM | POA: Diagnosis not present

## 2014-06-03 DIAGNOSIS — Y9389 Activity, other specified: Secondary | ICD-10-CM | POA: Insufficient documentation

## 2014-06-03 DIAGNOSIS — Y92410 Unspecified street and highway as the place of occurrence of the external cause: Secondary | ICD-10-CM | POA: Diagnosis not present

## 2014-06-03 DIAGNOSIS — S39012A Strain of muscle, fascia and tendon of lower back, initial encounter: Secondary | ICD-10-CM | POA: Diagnosis not present

## 2014-06-03 DIAGNOSIS — Y998 Other external cause status: Secondary | ICD-10-CM | POA: Insufficient documentation

## 2014-06-03 DIAGNOSIS — Z79899 Other long term (current) drug therapy: Secondary | ICD-10-CM | POA: Insufficient documentation

## 2014-06-03 DIAGNOSIS — S199XXA Unspecified injury of neck, initial encounter: Secondary | ICD-10-CM | POA: Diagnosis present

## 2014-06-03 MED ORDER — CYCLOBENZAPRINE HCL 10 MG PO TABS
5.0000 mg | ORAL_TABLET | Freq: Once | ORAL | Status: AC
  Administered 2014-06-03: 5 mg via ORAL
  Filled 2014-06-03: qty 1

## 2014-06-03 MED ORDER — HYDROCODONE-ACETAMINOPHEN 5-325 MG PO TABS
1.0000 | ORAL_TABLET | Freq: Once | ORAL | Status: AC
  Administered 2014-06-03: 1 via ORAL
  Filled 2014-06-03: qty 1

## 2014-06-03 MED ORDER — CYCLOBENZAPRINE HCL 5 MG PO TABS: 5.0000 mg | ORAL_TABLET | Freq: Two times a day (BID) | ORAL | Status: AC | PRN

## 2014-06-03 MED ORDER — HYDROCODONE-ACETAMINOPHEN 5-325 MG PO TABS: 1.0000 | ORAL_TABLET | Freq: Four times a day (QID) | ORAL | Status: AC | PRN

## 2014-06-03 NOTE — ED Notes (Signed)
NP at bedside.

## 2014-06-03 NOTE — Discharge Instructions (Signed)
Cervical Sprain A cervical sprain is when the tissues (ligaments) that hold the neck bones in place stretch or tear. HOME CARE   Put ice on the injured area.  Put ice in a plastic bag.  Place a towel between your skin and the bag.  Leave the ice on for 15-20 minutes, 3-4 times a day.  You may have been given a collar to wear. This collar keeps your neck from moving while you heal.  Do not take the collar off unless told by your doctor.  If you have long hair, keep it outside of the collar.  Ask your doctor before changing the position of your collar. You may need to change its position over time to make it more comfortable.  If you are allowed to take off the collar for cleaning or bathing, follow your doctor's instructions on how to do it safely.  Keep your collar clean by wiping it with mild soap and water. Dry it completely. If the collar has removable pads, remove them every 1-2 days to hand wash them with soap and water. Allow them to air dry. They should be dry before you wear them in the collar.  Do not drive while wearing the collar.  Only take medicine as told by your doctor.  Keep all doctor visits as told.  Keep all physical therapy visits as told.  Adjust your work station so that you have good posture while you work.  Avoid positions and activities that make your problems worse.  Warm up and stretch before being active. GET HELP IF:  Your pain is not controlled with medicine.  You cannot take less pain medicine over time as planned.  Your activity level does not improve as expected. GET HELP RIGHT AWAY IF:   You are bleeding.  Your stomach is upset.  You have an allergic reaction to your medicine.  You develop new problems that you cannot explain.  You lose feeling (become numb) or you cannot move any part of your body (paralysis).  You have tingling or weakness in any part of your body.  Your symptoms get worse. Symptoms include:  Pain,  soreness, stiffness, puffiness (swelling), or a burning feeling in your neck.  Pain when your neck is touched.  Shoulder or upper back pain.  Limited ability to move your neck.  Headache.  Dizziness.  Your hands or arms feel week, lose feeling, or tingle.  Muscle spasms.  Difficulty swallowing or chewing. MAKE SURE YOU:   Understand these instructions.  Will watch your condition.  Will get help right away if you are not doing well or get worse. Document Released: 06/16/2007 Document Revised: 08/30/2012 Document Reviewed: 07/05/2012 [facility] Patient Information 2015 [place], [place]. This information is not intended to replace advice given to you by your health care provider. Make sure you discuss any questions you have with your health care provider.  Back Pain, Adult Back pain is very common. The pain often gets better over time. The cause of back pain is usually not dangerous. Most people can learn to manage their back pain on their own.  HOME CARE   Stay active. Start with short walks on flat ground if you can. Try to walk farther each day.  Do not sit, drive, or stand in one place for more than 30 minutes. Do not stay in bed.  Do not avoid exercise or work. Activity can help your back heal faster.  Be careful when you bend or lift an object. Pepco HoldingsBend  at your knees, keep the object close to you, and do not twist. °· Sleep on a firm mattress. Lie on your side, and bend your knees. If you lie on your back, put a pillow under your knees. °· Only take medicines as told by your doctor. °· Put ice on the injured area. °¨ Put ice in a plastic bag. °¨ Place a towel between your skin and the bag. °¨ Leave the ice on for 15-20 minutes, 03-04 times a day for the first 2 to 3 days. After that, you can switch between ice and heat packs. °· Ask your doctor about back exercises or massage. °· Avoid feeling anxious or stressed. Find good ways to deal with stress, such as exercise. °GET HELP RIGHT  AWAY IF:  °· Your pain does not go away with rest or medicine. °· Your pain does not go away in 1 week. °· You have new problems. °· You do not feel well. °· The pain spreads into your legs. °· You cannot control when you poop (bowel movement) or pee (urinate). °· Your arms or legs feel weak or lose feeling (numbness). °· You feel sick to your stomach (nauseous) or throw up (vomit). °· You have belly (abdominal) pain. °· You feel like you may pass out (faint). °MAKE SURE YOU:  °· Understand these instructions. °· Will watch your condition. °· Will get help right away if you are not doing well or get worse. °Document Released: 06/16/2007 Document Revised: 03/22/2011 Document Reviewed: 05/01/2013 °ExitCare® Patient Information ©2015 [employer], [employer]. This information is not intended to replace advice given to you by your health care provider. Make sure you discuss any questions you have with your health care provider. ° °

## 2014-06-03 NOTE — ED Notes (Signed)
Pt requesting copy of XR. Will contact radiology.
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
## 2014-06-03 NOTE — ED Notes (Signed)
Patient preparing for discharge. 
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
